# Patient Record
Sex: Male | Born: 1985 | Race: White | Hispanic: No | Marital: Single | Smoking: Current every day smoker
Health system: Southern US, Community
[De-identification: ages and names within clinical notes are randomized; demographics above are authoritative.]

## PROBLEM LIST (undated history)

## (undated) DIAGNOSIS — B20 Human immunodeficiency virus [HIV] disease: Secondary | ICD-10-CM

## (undated) DIAGNOSIS — J9819 Other pulmonary collapse: Secondary | ICD-10-CM

## (undated) HISTORY — PX: TONSILLECTOMY: SUR1361

## (undated) HISTORY — DX: Other pulmonary collapse: J98.19

## (undated) HISTORY — PX: TYMPANOSTOMY TUBE PLACEMENT: SHX32

## (undated) HISTORY — DX: Human immunodeficiency virus (HIV) disease: B20

## (undated) HISTORY — PX: CHEST TUBE INSERTION: SHX231

---

## 2016-08-02 ENCOUNTER — Ambulatory Visit (INDEPENDENT_AMBULATORY_CARE_PROVIDER_SITE_OTHER): Payer: BC Managed Care – PPO | Admitting: Allergy and Immunology

## 2016-08-02 ENCOUNTER — Encounter: Payer: Self-pay | Admitting: Allergy and Immunology

## 2016-08-02 VITALS — BP 120/80 | HR 100 | Temp 98.3°F | Resp 16 | Ht 69.45 in | Wt 188.6 lb

## 2016-08-02 DIAGNOSIS — K219 Gastro-esophageal reflux disease without esophagitis: Secondary | ICD-10-CM

## 2016-08-02 DIAGNOSIS — F5101 Primary insomnia: Secondary | ICD-10-CM

## 2016-08-02 DIAGNOSIS — L0293 Carbuncle, unspecified: Secondary | ICD-10-CM

## 2016-08-02 DIAGNOSIS — H6982 Other specified disorders of Eustachian tube, left ear: Secondary | ICD-10-CM | POA: Diagnosis not present

## 2016-08-02 DIAGNOSIS — R51 Headache: Secondary | ICD-10-CM | POA: Diagnosis not present

## 2016-08-02 DIAGNOSIS — J3089 Other allergic rhinitis: Secondary | ICD-10-CM | POA: Diagnosis not present

## 2016-08-02 DIAGNOSIS — R519 Headache, unspecified: Secondary | ICD-10-CM

## 2016-08-02 MED ORDER — MUPIROCIN 2 % EX OINT: TOPICAL_OINTMENT | CUTANEOUS | 5 refills | Status: DC

## 2016-08-02 MED ORDER — RANITIDINE HCL 300 MG PO TABS: 300.0000 mg | ORAL_TABLET | Freq: Every day | ORAL | 5 refills | Status: DC

## 2016-08-02 MED ORDER — DEXLANSOPRAZOLE 60 MG PO CPDR: DELAYED_RELEASE_CAPSULE | ORAL | 5 refills | Status: DC

## 2016-08-02 MED ORDER — CYPROHEPTADINE HCL 4 MG PO TABS: ORAL_TABLET | ORAL | 5 refills | Status: DC

## 2016-08-02 MED ORDER — MONTELUKAST SODIUM 10 MG PO TABS: 10.0000 mg | ORAL_TABLET | Freq: Every day | ORAL | 5 refills | Status: DC

## 2016-08-02 NOTE — Patient Instructions (Addendum)
  1. Allergen avoidance measures  2. Treat and prevent inflammation:   A. montelukast 10 mg tablet 1 time per day  B. OTC Nasacort/Rhinocort one spray each nostril one time per day  3. Treat and prevent LPR:   A. slowly consolidate all caffeine and chocolate use  B. Dexilant 60 mg 1 time per day in a.m.  C. ranitidine 300 mg 1 time per day in p.m.  4. Treat and prevent headache and insomnia:   A. slowly consolidate all caffeine and chocolate use  B. start Periactin 4 mg tablet - one half-one tablet at bedtime  C. attempt to avoid use of analgesics  5. Treat and prevent cutaneous infections:   A. "bleach bath" at least a few times a week  B. Bactroban applied to active skin lesion 3 times a day until resolved  C. can continue topical steroid to "old" skin lesions  6. If needed:   A. nasal saline  B. OTC antihistamine  7. Tube placement in left ear?  8. Return to clinic in 4 weeks or earlier if problem

## 2016-08-02 NOTE — Progress Notes (Signed)
NEW PATIENT NOTE  Referring Provider: Ignacia Marvel,* Primary Provider: Ignacia Marvel, DO Date of office visit: 08/02/2016    Subjective:   Chief Complaint:  Don Reilly (DOB: July 11, 1985) is a 31 y.o. male who presents to the clinic on 08/02/2016 with a chief complaint of Sinus Problem .  HPI: Don Reilly presents to this clinic in evaluation of several issues.  First, he has nasal congestion and feeling as though his airway is constricted. He has minimal amounts of sneezing and has no anosmia or ugly nasal discharge. He does use Flonase on a relatively consistent basis. There is no obvious provoking factor giving rise to this issue.  Second, he has headaches located at the top of his head that are throbbing and splitting on a daily basis but usually start at work. He believes that this is related to looking into a computer. He will get associated "static" vision with these headaches but does not get any associated vomiting or other neurological symptoms. He does find it hard to function. He takes ibuprofen on a daily basis and sometimes uses ibuprofen even before going to work. He does drink caffeine at one cup of coffee per day and 1 tea per day and eats chocolate about every other day and does not consume any alcohol.  Third, he has constant postnasal drip and throat clearing. He has a glob stuck in his throat and can't clear out his throat and has intermittent raspy voice. He does have reflux disease and regurgitates about twice a week with a burning material. As noted above he does consume caffeine on a daily basis and chocolate about every other day.  Fourth, he's been having problems with his left ear. He was evaluated at [facility] where ETD was documented without evidence of cholesteatoma. He did have placement of ear ventilation tubes as a child but there was no discussion about placing a tube in his left ear with his last ENT visit at [facility]. He does complain about  having some muffled hearing and he has difficulty placing where sounds are originating from.  Fifth, he states that he vomits when eating shrimp. He has no associated systemic or constitutional symptoms. He does not really eat shellfish in general.  Sixth, he has an issue with rashes on his skin. He develops these red raised inflamed areas that appear to leave behind a scar. He has them across his body. He's been diagnosed with atopic dermatitis and cutaneous infections and fungus among other diagnoses. He's been treated with topical steroids and what sounds like various antibiotics for MRSA. He has no other infections that are active other than the fact that he is HIV positive with undetectable RNA and CD4 above 500 while on anti-HIV therapy.  Past Medical History:  Diagnosis Date  . HIV (human immunodeficiency virus infection) (HCC)   . Lung collapse     Past Surgical History:  Procedure Laterality Date  . CHEST TUBE INSERTION     For collapsed lung  . TONSILLECTOMY    . TYMPANOSTOMY TUBE PLACEMENT      Allergies as of 08/02/2016   No Known Allergies     Medication List      APPLE CIDER VINEGAR PO Take by mouth.   CHARCOAL ACTIVATED PO Take by mouth 2 (two) times daily.   fluocinonide cream 0.05 % Commonly known as:  LIDEX   GENVOYA 150-150-200-10 MG Tabs tablet Generic drug:  elvitegravir-cobicistat-emtricitabine-tenofovir Take 1 tablet by mouth daily.  hydrOXYzine 50 MG tablet Commonly known as:  ATARAX/VISTARIL   multivitamin tablet Take 1 tablet by mouth daily.   Vitamin D3 1000 units Caps Take by mouth.       Review of systems negative except as noted in HPI / PMHx or noted below:  Review of Systems  Constitutional: Negative.   HENT: Negative.   Eyes: Negative.   Respiratory: Negative.   Cardiovascular: Negative.   Gastrointestinal: Negative.   Genitourinary: Negative.   Musculoskeletal: Negative.   Skin: Negative.   Neurological: Negative.     Endo/Heme/Allergies: Negative.   Psychiatric/Behavioral: Negative.     Family History  Problem Relation Age of Onset  . Aneurysm Father     Aortic aneurysm  . Cancer Sister   . Heart disease Maternal Grandmother   . Leukemia Paternal Grandmother   . Heart attack Paternal Grandfather     Social History   Social History  . Marital status: Unknown    Spouse name: N/A  . Number of children: N/A  . Years of education: N/A   Occupational History  . Not on file.   Social History Main Topics  . Smoking status: Current Every Day Smoker    Packs/day: 0.50    Types: Cigarettes  . Smokeless tobacco: Never Used  . Alcohol use No  . Drug use: No  . Sexual activity: Not on file   Other Topics Concern  . Not on file   Social History Narrative  . No narrative on file    Environmental and Social history  Don Reilly lives in a house with a dry environment, no animals located inside the household, carpeting in the bedroom, no plastic on the bed or pillow, actually smoking tobacco products. 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  Objective:   Vitals:   08/02/16 0952  BP: 120/80  Pulse: 100  Resp: 16  Temp: 98.3 F (36.8 C)   Height: 5' 9.45" (176.4 cm) Weight: 188 lb 9.6 oz (85.5 kg)  Physical Exam  Constitutional: He is well-developed, well-nourished, and in no distress.  HENT:  Head: Normocephalic. Head is without right periorbital erythema and without left periorbital erythema.  Right Ear: Tympanic membrane, external ear and ear canal normal.  Left Ear: External ear and ear canal normal. Tympanic membrane is scarred and retracted.  Nose: Nose normal. No mucosal edema or rhinorrhea.  Mouth/Throat: Uvula is midline, oropharynx is clear and moist and mucous membranes are normal. No oropharyngeal exudate.  Eyes: Conjunctivae and lids are normal. Pupils are equal, round, and reactive to light.  Neck: Trachea normal. No tracheal tenderness present. No  tracheal deviation present. No thyromegaly present.  Cardiovascular: Normal rate, regular rhythm, S1 normal, S2 normal and normal heart sounds.   No murmur heard. Pulmonary/Chest: Effort normal and breath sounds normal. No stridor. No tachypnea. No respiratory distress. He has no wheezes. He has no rales. He exhibits no tenderness.  Abdominal: Soft. He exhibits no distension and no mass. There is no hepatosplenomegaly. There is no tenderness. There is no rebound and no guarding.  Musculoskeletal: He exhibits no edema or tenderness.  Lymphadenopathy:       Head (right side): No tonsillar adenopathy present.       Head (left side): No tonsillar adenopathy present.    He has no cervical adenopathy.    He has no axillary adenopathy.  Neurological: He is alert. Gait normal.  Skin: Rash (multiple slightly erythematous nodules with evidence of excoriation and  unroofing across his lower extremities and trunk and left hand.) noted. He is not diaphoretic. No erythema. No pallor. Nails show no clubbing.  Psychiatric: Mood and affect normal.    Diagnostics: Allergy skin tests were performed. He demonstrated hypersensitivity against house dust mite  Assessment and Plan:    1. Other allergic rhinitis   2. ETD (Eustachian tube dysfunction), left   3. Headache disorder   4. Primary insomnia   5. LPRD (laryngopharyngeal reflux disease)   6. Carbuncle     1. Allergen avoidance measures  2. Treat and prevent inflammation:   A. montelukast 10 mg tablet 1 time per day  B. OTC Nasacort/Rhinocort one spray each nostril one time per day  3. Treat and prevent LPR:   A. slowly consolidate all caffeine and chocolate use  B. Dexilant 60 mg 1 time per day in a.m.  C. ranitidine 300 mg 1 time per day in p.m.  4. Treat and prevent headache and insomnia:   A. slowly consolidate all caffeine and chocolate use  B. start Periactin 4 mg tablet - one half-one tablet at bedtime  C. attempt to avoid use of  analgesics  5. Treat and prevent cutaneous infections:   A. "bleach bath" at least a few times a week  B. Bactroban applied to active skin lesion 3 times a day until resolved  C. can continue topical steroid to "old" skin lesions  6. If needed:   A. nasal saline  B. OTC antihistamine  7. Tube placement in left ear?  8. Return to clinic in 4 weeks or earlier if problem  Dennise has several distinct issues ongoing at this point in time that require attention. He appears to have some inflammation of his respiratory tract for which he will utilize anti-inflammatory agents and allergen avoidance measures. 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 As well he appears to have a component of LPR and ETD for which she will consolidate his caffeine consumption and utilize therapy directed against gastric acid production and possibly consider ear ventilation tube placement in his left ear. He does not do well with medical therapy over the course of the next several weeks. Finally, he has a history very consistent with recurrent cutaneous infections and I'm going to have him utilize a bleach bath on a pretty regular basis as well as using Bactroban to any new skin lesions that develop. I'll regroup with him in 4 weeks to assess his response to this plan and consider further evaluation and treatment based upon his response.  Laurette Schimke, MD Allergy / Immunology [facility]

## 2016-09-03 ENCOUNTER — Ambulatory Visit: Payer: BC Managed Care – PPO | Admitting: Allergy and Immunology

## 2016-09-05 ENCOUNTER — Ambulatory Visit (HOSPITAL_COMMUNITY)
Admission: EM | Admit: 2016-09-05 | Discharge: 2016-09-05 | Disposition: A | Discharge status: Home / Self Care | Payer: BC Managed Care – PPO | Attending: Emergency Medicine | Admitting: Emergency Medicine

## 2016-09-05 ENCOUNTER — Encounter (HOSPITAL_COMMUNITY): Payer: Self-pay | Admitting: Emergency Medicine

## 2016-09-05 DIAGNOSIS — K580 Irritable bowel syndrome with diarrhea: Secondary | ICD-10-CM | POA: Diagnosis not present

## 2016-09-05 DIAGNOSIS — L739 Follicular disorder, unspecified: Secondary | ICD-10-CM

## 2016-09-05 MED ORDER — DICYCLOMINE HCL 20 MG PO TABS: 20.0000 mg | ORAL_TABLET | Freq: Two times a day (BID) | ORAL | 0 refills | Status: AC

## 2016-09-05 MED ORDER — DOXYCYCLINE HYCLATE 100 MG PO CAPS: 100.0000 mg | ORAL_CAPSULE | Freq: Two times a day (BID) | ORAL | 0 refills | Status: DC

## 2016-09-05 NOTE — ED Triage Notes (Signed)
The patient presented to the [facility] with a complaint of abdominal bloating and pressure. The patient also reported some "issues" with his skin. The patient related his symptoms to contaminated water.

## 2016-09-05 NOTE — Discharge Instructions (Signed)
Your Abdominal symptoms are consistent with IBS, I have started you on Bentyl, take 1 tablet twice a day. Increase dietary fiber, decrease greasy foods, follow up with your primary care provider.  For your folliculitis, I am switching your antibiotic to Doxycycline. Take 1 tablet twice a day for 10 days, follow up with your primary care provider or dermatology.

## 2016-09-05 NOTE — ED Provider Notes (Signed)
CSN: [id number]     Arrival date & time 09/05/16  1123 History   First MD Initiated Contact with Patient 09/05/16 1253     Chief Complaint  Patient presents with  . Bloated   (Consider location/radiation/quality/duration/timing/severity/associated sxs/prior Treatment) 31 year old male with a prior history of HIV presents to clinic for a 6 month history of abdominal pain, nearly daily, with frequent loose stools, 3-4 times a day, and a sensation of "bloating" with defecation. Has no fever, no change in diet, no nausea, vomiting, change in appetite, weakness, or other symptoms.  Also complaining of "folliculitis" has taken Bactrim prescribed by his PCP, without relief. Has multiple, small, erythemic, pustules located on his legs, and abdomen, states it is worse when he shaves, and he has shaved these areas recently.   The history is provided by the patient.    Past Medical History:  Diagnosis Date  . HIV (human immunodeficiency virus infection) (HCC)   . Lung collapse    Past Surgical History:  Procedure Laterality Date  . CHEST TUBE INSERTION     For collapsed lung  . TONSILLECTOMY    . TYMPANOSTOMY TUBE PLACEMENT     Family History  Problem Relation Age of Onset  . Aneurysm Father     Aortic aneurysm  . Cancer Sister   . Heart disease Maternal Grandmother   . Leukemia Paternal Grandmother   . Heart attack Paternal Grandfather    Social History  Substance Use Topics  . Smoking status: Current Every Day Smoker    Packs/day: 0.50    Types: Cigarettes  . Smokeless tobacco: Never Used  . Alcohol use No    Review of Systems  Constitutional: Negative.   HENT: Negative.   Respiratory: Negative.   Cardiovascular: Negative.   Gastrointestinal: Positive for abdominal pain. Negative for diarrhea, nausea and vomiting.  Genitourinary: Negative.   Musculoskeletal: Negative.   Skin: Positive for color change and rash.  Neurological: Negative.     Allergies  Patient has no  known allergies.  Home Medications   Prior to Admission medications   Medication Sig Start Date End Date Taking? Authorizing Provider  GENVOYA 150-150-200-10 MG TABS tablet Take 1 tablet by mouth daily.  05/30/16  Yes Historical Provider, MD  dicyclomine (BENTYL) 20 MG tablet Take 1 tablet (20 mg total) by mouth 2 (two) times daily. 09/05/16   Dorena Bodo, NP  doxycycline (VIBRAMYCIN) 100 MG capsule Take 1 capsule (100 mg total) by mouth 2 (two) times daily. 09/05/16   Dorena Bodo, NP   Meds Ordered and Administered this Visit  Medications - No data to display  BP 131/83 (BP Location: Right Arm)   Pulse 94   Temp 97.6 F (36.4 C) (Oral)   Resp 16   SpO2 100%  No data found.   Physical Exam  Constitutional: He is oriented to person, place, and time. He appears well-developed and well-nourished. No distress.  HENT:  Head: Normocephalic and atraumatic.  Right Ear: External ear normal.  Left Ear: External ear normal.  Eyes: Conjunctivae are normal. Right eye exhibits no discharge. Left eye exhibits no discharge.  Neck: Normal range of motion.  Cardiovascular: Normal rate and regular rhythm.   Pulmonary/Chest: Effort normal and breath sounds normal.  Abdominal: Soft. Bowel sounds are normal. He exhibits no distension. There is no tenderness.  Neurological: He is alert and oriented to person, place, and time.  Skin: Skin is warm and dry. Rash noted. He is not diaphoretic. There is erythema.  There is pallor.  Multiple, red, erythematous, pustules, abdomen, chest, legs.  Nursing note and vitals reviewed.   Urgent Care Course     Procedures (including critical care time)  Labs Review Labs Reviewed - No data to display  Imaging Review No results found.      MDM   1. Irritable bowel syndrome with diarrhea   2. Folliculitis    Symptoms consistent with rum criteria for IBS, provided diet counseling, started on Bentyl, recommend following up with primary care for  further evaluation and management of this condition.  Pustular lesions consistent with folliculitis, recommend doxycycline twice daily. Follow-up with primary care, or dermatologist.     Dorena Bodo, NP 09/05/16 2208

## 2016-10-15 ENCOUNTER — Encounter (HOSPITAL_COMMUNITY): Payer: Self-pay | Admitting: Emergency Medicine

## 2016-10-15 ENCOUNTER — Emergency Department (HOSPITAL_COMMUNITY)
Admission: EM | Admit: 2016-10-15 | Discharge: 2016-10-15 | Disposition: A | Discharge status: Home / Self Care | Payer: BC Managed Care – PPO | Attending: Physician Assistant | Admitting: Physician Assistant

## 2016-10-15 ENCOUNTER — Emergency Department (HOSPITAL_COMMUNITY): Payer: BC Managed Care – PPO

## 2016-10-15 DIAGNOSIS — H53149 Visual discomfort, unspecified: Secondary | ICD-10-CM | POA: Diagnosis present

## 2016-10-15 DIAGNOSIS — Z79899 Other long term (current) drug therapy: Secondary | ICD-10-CM | POA: Insufficient documentation

## 2016-10-15 DIAGNOSIS — F17210 Nicotine dependence, cigarettes, uncomplicated: Secondary | ICD-10-CM | POA: Diagnosis not present

## 2016-10-15 DIAGNOSIS — B20 Human immunodeficiency virus [HIV] disease: Secondary | ICD-10-CM | POA: Insufficient documentation

## 2016-10-15 DIAGNOSIS — F69 Unspecified disorder of adult personality and behavior: Secondary | ICD-10-CM | POA: Insufficient documentation

## 2016-10-15 DIAGNOSIS — F15959 Other stimulant use, unspecified with stimulant-induced psychotic disorder, unspecified: Secondary | ICD-10-CM | POA: Insufficient documentation

## 2016-10-15 DIAGNOSIS — F99 Mental disorder, not otherwise specified: Secondary | ICD-10-CM

## 2016-10-15 LAB — CBC WITH DIFFERENTIAL/PLATELET
BASOS ABS: 0 10*3/uL (ref 0.0–0.1)
Basophils Relative: 1 %
Eosinophils Absolute: 0.1 10*3/uL (ref 0.0–0.7)
Eosinophils Relative: 2 %
HEMATOCRIT: 40.4 % (ref 39.0–52.0)
Hemoglobin: 13.2 g/dL (ref 13.0–17.0)
LYMPHS ABS: 1.2 10*3/uL (ref 0.7–4.0)
LYMPHS PCT: 24 %
MCH: 30.9 pg (ref 26.0–34.0)
MCHC: 32.7 g/dL (ref 30.0–36.0)
MCV: 94.6 fL (ref 78.0–100.0)
Monocytes Absolute: 0.7 10*3/uL (ref 0.1–1.0)
Monocytes Relative: 14 %
NEUTROS ABS: 2.9 10*3/uL (ref 1.7–7.7)
Neutrophils Relative %: 59 %
Platelets: 156 10*3/uL (ref 150–400)
RBC: 4.27 MIL/uL (ref 4.22–5.81)
RDW: 13.1 % (ref 11.5–15.5)
WBC: 5 10*3/uL (ref 4.0–10.5)

## 2016-10-15 LAB — COMPREHENSIVE METABOLIC PANEL
ALBUMIN: 3.8 g/dL (ref 3.5–5.0)
ALT: 16 U/L — ABNORMAL LOW (ref 17–63)
ANION GAP: 5 (ref 5–15)
AST: 18 U/L (ref 15–41)
Alkaline Phosphatase: 47 U/L (ref 38–126)
BILIRUBIN TOTAL: 0.7 mg/dL (ref 0.3–1.2)
BUN: 11 mg/dL (ref 6–20)
CHLORIDE: 104 mmol/L (ref 101–111)
CO2: 28 mmol/L (ref 22–32)
Calcium: 8.9 mg/dL (ref 8.9–10.3)
Creatinine, Ser: 0.93 mg/dL (ref 0.61–1.24)
GFR calc Af Amer: >60 mL/min (ref >60)
GFR calc non Af Amer: >60 mL/min (ref >60)
GLUCOSE: 100 mg/dL — AB (ref 65–99)
POTASSIUM: 3.4 mmol/L — AB (ref 3.5–5.1)
Sodium: 137 mmol/L (ref 135–145)
TOTAL PROTEIN: 6.2 g/dL — AB (ref 6.5–8.1)

## 2016-10-15 LAB — RAPID URINE DRUG SCREEN, HOSP PERFORMED
AMPHETAMINES: POSITIVE — AB
BARBITURATES: NOT DETECTED
BENZODIAZEPINES: NOT DETECTED
Cocaine: NOT DETECTED
Opiates: NOT DETECTED
TETRAHYDROCANNABINOL: NOT DETECTED

## 2016-10-15 LAB — ACETAMINOPHEN LEVEL: Acetaminophen (Tylenol), Serum: <10 ug/mL — ABNORMAL LOW (ref 10–30)

## 2016-10-15 LAB — ETHANOL: Alcohol, Ethyl (B): <5 mg/dL (ref <5)

## 2016-10-15 LAB — SALICYLATE LEVEL: Salicylate Lvl: <7 mg/dL (ref 2.8–30.0)

## 2016-10-15 NOTE — ED Triage Notes (Addendum)
Pt c/o "light sensitivity" and floaters with muscle weakness x 6 months.  Pt also c/o increase in fungus growth on nails after finding mold spores in crawl space.  Pt c/o reports feeling the is having difficulty taking deep breath.  +HA, +nausea. +lightheadedness x 2 months, abd distention x 1 month intermittently. Pt would like discoloration to R heel assessed.  Pt has wound in various stages visible on arms, abd, legs and face.  Pt appears anxious in triage.

## 2016-10-15 NOTE — ED Notes (Addendum)
Pt reports light sensitivity for the past 6 months. Pt reports he pulled something blue out of his body. Pt reports that he feels as though something is inside his body. Pt reports achy bones and muscles. Pt also reports mold exposure.

## 2016-10-15 NOTE — Discharge Instructions (Signed)
Your labs were reassuring. We thought you had mildly psychotic features today. We have given you resources to help as an outpatient. He also had positive methamphetamines. Please consider stopping taking these.  Please follow-up with your primary care about other concerns.  Behavioral Health Resources in the [facility]  Intensive Outpatient Programs: [facility]      [address] [place], [place] [phone] Both a day and evening program       [facility]     [address]        [place], [place] [address] [phone]         [facility] [address] [place] [phone]  [facility] ACCESS LINE: [phone] or [phone] [address] [place], [place] [address] [email or web address]  Mobile Crisis Teams:                                        [facility]         Mobile Crisis Care Unit [phone]             [facility] [address] [place] [phone]                                         [facility] [address] [place] [phone]  Self-Help/Support Groups: [facility] Health [facility]oc[facility] of [facility] [place] Variety of support groups [phone] (call for more info)  Narcotics Anonymous (NA) [facility]aring [facility] [address] [place] - 2 meetings at this location  Residential Treatment Programs:  [facility]      [address]        [place]       [phone]         [facility] [address], [place] [address] [place], [place]  [id number] [phone]  [facility]  [address] [place], [place] [address] [phone] Admissions: 8am-3pm M-F  [facility]     [address]        [place], [place] [address]       [phone]         [facility] [address] [place], [place] [phone]  [facility] [address] [place], [place] [phone]  [facility] - Intensive Outpatient      [address]     [place], [place]       [phone]         [facility]     [address] [place], [place] [phone] or [phone]  [facility] ([facility])  [address] [place], [place] [phone]  [facility] [address]                                               [address] [place] [phone]  [facility] Resources: [phone]- [phone]               General Therapy  Domenic Schwab, PhD        [place], [place] [address]         [facility]   [address] [place], [place] [address] [phone]  [facility] [address] [place], [place] [address] [phone] Insurance/Medicaid/sponsorship through [facility]                                              [address] [place]                                        [place], [place] [address]    Therapy/tele-psych/case         [place] [address], [place]  [address]  Adolescent/group home/case management [phone]                                           Rosette Reveal PhD       General therapy       Insurance   [phone]         Dr. Adele Schilder Insurance [phone] M-F  [facility] Medicaid, sponsorship [phone]

## 2016-10-15 NOTE — ED Provider Notes (Signed)
[facility]-EMERGENCY DEPT Provider Note   CSN: [id number] Arrival date & time: 10/15/16  0117  By signing my name below, I, Diona Browner, attest that this documentation has been prepared under the direction and in the presence of Kyden Potash, Cindee Salt, MD. Electronically Signed: Diona Browner, ED Scribe. 10/15/16. 2:25 AM.  History   Chief Complaint Chief Complaint  Patient presents with  . Light sensitivity    HPI Don Reilly is a 31 y.o. male with a PMHx of HIV who presents to the Emergency Department complaining of light sensitivity for the last 6 months. Pt reports feeling drained after being under bright lights and long exposures to cell phone screens. He also notes metal burns him to the touch.   Additionally, pt c/o of something in his stomach, possibly "blue stringy worms", which he has pulled out from multiple lesions on his abdomen. He notes lesions haven't improved and blisters on his hands for the last 4 months.   Finally, he c/o "exposure to gross amounts of fungus" that was found in his house.  Associated sx include diaphoresis, chills, body aches, and unexplained weight change without changing his diet.  Pt is on genvoya for HIV, which is monitored by his Dr. at [facility]. 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 He has not tried anything to alleviate his sx. Pt denies SI and HI.  The history is provided by the patient. No language interpreter was used.    Past Medical History:  Diagnosis Date  . HIV (human immunodeficiency virus infection) (HCC)   . Lung collapse     There are no active problems to display for this patient.   Past Surgical History:  Procedure Laterality Date  . CHEST TUBE INSERTION     For collapsed lung  . TONSILLECTOMY    . TYMPANOSTOMY TUBE PLACEMENT         Home Medications    Prior to Admission medications   Medication Sig Start Date End Date Taking? Authorizing Provider  dicyclomine (BENTYL) 20 MG tablet Take 1  tablet (20 mg total) by mouth 2 (two) times daily. 09/05/16   Dorena Bodo, NP  doxycycline (VIBRAMYCIN) 100 MG capsule Take 1 capsule (100 mg total) by mouth 2 (two) times daily. 09/05/16   Dorena Bodo, NP  GENVOYA 150-150-200-10 MG TABS tablet Take 1 tablet by mouth daily.  05/30/16   [provider]    Family History Family History  Problem Relation Age of Onset  . Aneurysm Father        Aortic aneurysm  . Cancer Sister   . Heart disease Maternal Grandmother   . Leukemia Paternal Grandmother   . Heart attack Paternal Grandfather     Social History Social History  Substance Use Topics  . Smoking status: Current Every Day Smoker    Packs/day: 0.50    Types: Cigarettes  . Smokeless tobacco: Never Used  . Alcohol use No     Allergies   Patient has no known allergies.   Review of Systems Review of Systems  Constitutional: Positive for chills, diaphoresis and unexpected weight change.  Eyes: Positive for photophobia.  Musculoskeletal: Positive for arthralgias and myalgias.  Skin: Positive for wound.  Psychiatric/Behavioral: Negative for suicidal ideas.  All other systems reviewed and are negative.    Physical Exam Updated Vital Signs BP (!) 141/85 (BP Location: Left Arm)   Pulse 100   Temp 98.4 F (36.9 C) (Oral)   Resp 20   Ht  5\' 11"  (1.803 m)   Wt 193 lb (87.5 kg)   SpO2 100%   BMI 26.92 kg/m   Physical Exam  Constitutional: He is oriented to person, place, and time. He appears well-developed and well-nourished.  HENT:  Head: Normocephalic.  Eyes: EOM are normal.  Neck: Normal range of motion.  Pulmonary/Chest: Effort normal and breath sounds normal.  Abdominal: Soft. Bowel sounds are normal. He exhibits no distension. There is no tenderness.  Musculoskeletal: Normal range of motion.  Neurological: He is alert and oriented to person, place, and time.  Skin:  Scattered lesions.  No active infection.  Psychiatric: He has a normal mood  and affect.  Pt persceverating on odd sx. Affect odd.   Nursing note and vitals reviewed.    ED Treatments / Results  DIAGNOSTIC STUDIES: Oxygen Saturation is 100% on RA, normal by my interpretation.   COORDINATION OF CARE: 2:25 AM-Discussed next steps with pt. Pt verbalized understanding and is agreeable with the plan.   Labs (all labs ordered are listed, but only abnormal results are displayed) Labs Reviewed - No data to display  EKG  EKG Interpretation None       Radiology No results found.  Procedures Procedures (including critical care time)  Medications Ordered in ED Medications - No data to display   Initial Impression / Assessment and Plan / ED Course  I have reviewed the triage vital signs and the nursing notes.  Pertinent labs & imaging results that were available during my care of the patient were reviewed by me and considered in my medical decision making (see chart for details).     I personally performed the services described in this documentation, which was scribed in my presence. The recorded information has been reviewed and is accurate.   Patient is a 31 year old male with multiple inpatient psychiatric hospitalizations for psychotic symptoms. Patient here today with multiple symptoms that appear psychotic in nature. He is talking about blue worms that could be in his abdomen. When he showed me a picture it was lint. Perseverating on multiple "symptoms" he has.   I think he will require TTS clearance for me to feel safe discharging him. He has no active HI or SI. But he does feel like something is inside his body.   6:11 AM Discussed with TTS. They do not feel patient needs inpatient requirement. 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 He's been seen for this in the past. We will offer outpatient resources.   Final Clinical Impressions(s) / ED Diagnoses   Final diagnoses:  None    New  Prescriptions New Prescriptions   No medications on file     Abelino DerrickMackuen, Lamon Rotundo Lyn, MD 10/15/16 [phone]

## 2016-10-15 NOTE — BH Assessment (Addendum)
Tele Assessment Note   Don Reilly is an 31 y.o. single male who [facility] reporting light sensitivity, that he feels drained after being under bright lights and that metal burns to the touch. He also reports multiple lesions on his abdomen. Dr. Corlis LeakMackuen documented "pt c/o of something in his stomach, possibly "blue stringy worms", which he has pulled out from multiple lesions on his abdomen." He reports "exposure to gross amounts of fungus" that was found in his housePt says that the worms comment was a misunderstanding. Pt denies depressive symptoms and describes his mood as "even." He denies problems with anxiety. He denies current suicidal ideation or history of suicide attempts. Pt denies current homicidal ideation or history of violence. Pt denies auditory or visual hallucinations. Pt denies any history of alcohol or substance use. When asked why his UDS is positive for amphetamines Pt cannot provide an explanation.  Pt denies any stressors other than presenting medical concerns. 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 [facility] with similar presentation and was discharged.  Pt is dressed in hospital gown, oriented x4 with normal speech and normal motor behavior. Pt initially very drowsy then more alert. Eye contact is fair. Pt's mood is euthymic and affect is congruent with mood. Thought process is coherent and relevant. There is no indication Pt is currently responding to internal stimuli. Pt is focused on somatic symptoms. Pt says he does not have mental health or substance abuse problems.   Diagnosis: Amphetamine-induced Psychotic Disorder  Past Medical  History:  Past Medical History:  Diagnosis Date  . HIV (human immunodeficiency virus infection) (HCC)   . Lung collapse     Past Surgical History:  Procedure Laterality Date  . CHEST TUBE INSERTION     For collapsed lung  . TONSILLECTOMY    . TYMPANOSTOMY TUBE PLACEMENT      Family History:  Family History  Problem Relation Age of Onset  . Aneurysm Father        Aortic aneurysm  . Cancer Sister   . Heart disease Maternal Grandmother   . Leukemia Paternal Grandmother   . Heart attack Paternal Grandfather     Social History:  reports that he has been smoking Cigarettes.  He has been smoking about 0.50 packs per day. He has never used smokeless tobacco. He reports that he does not drink alcohol or use drugs.  Additional Social History:  Alcohol / Drug Use Pain Medications: See MAR Prescriptions: See MAR Over the Counter: See MAR History of alcohol / drug use?: No history of alcohol / drug abuse (Pt has no explanation why UDS is positive for amphetamines) Longest period of sobriety (when/how long): NA  CIWA: CIWA-Ar BP: 123/81 Pulse Rate: 94 COWS:    PATIENT STRENGTHS: (choose at least two) Ability for insight Average or above average intelligence Capable of independent living MetallurgistCommunication skills Financial means General fund of knowledge Physical Health Supportive family/friends Work skills  Allergies: No Known Allergies  Home Medications:  (Not in a hospital admission)  OB/GYN Status:  No LMP for male patient.  General Assessment Data [facility] Assessment: In system Is this a Tele or Face-to-Face Assessment?: Tele Assessment Is  this an Initial Assessment or a Re-assessment for this encounter?: Initial Assessment Marital status: Single Maiden name: NA Is patient pregnant?: No Pregnancy Status: No Living Arrangements: Alone Can pt return to current living arrangement?: Yes Admission Status: Voluntary Is patient capable of  signing voluntary admission?: Yes Referral Source: Self/Family/Friend Insurance type: BCBS     Crisis Care Plan Living Arrangements: Alone Legal Guardian: Other: (Self) Name of Psychiatrist: None Name of Therapist: None  Education Status Is patient currently in school?: No Current Grade: NA Highest grade of school patient has completed: 29 Name of school: NA Contact person: NA  Risk to self with the past 6 months Suicidal Ideation: No Has patient been a risk to self within the past 6 months prior to admission? : No Suicidal Intent: No Has patient had any suicidal intent within the past 6 months prior to admission? : No Is patient at risk for suicide?: No Suicidal Plan?: No Has patient had any suicidal plan within the past 6 months prior to admission? : No Access to Means: No What has been your use of drugs/alcohol within the last 12 months?: Pt denies Previous Attempts/Gestures: No How many times?: 0 Other Self Harm Risks: None Triggers for Past Attempts: None known Intentional Self Injurious Behavior: None Family Suicide History: No Recent stressful life event(s): Other (Comment) (Physical symptoms) Persecutory voices/beliefs?: No Depression: No Depression Symptoms:  (Pt denies depressive symptoms) Substance abuse history and/or treatment for substance abuse?: No Suicide prevention information given to non-admitted patients: Not applicable  Risk to Others within the past 6 months Homicidal Ideation: No Does patient have any lifetime risk of violence toward others beyond the six months prior to admission? : No Thoughts of Harm to Others: No Current Homicidal Intent: No Current Homicidal Plan: No Access to Homicidal Means: No Identified Victim: None History of harm to others?: No Assessment of Violence: None Noted Violent Behavior Description: Pt denies history of violence Does patient have access to weapons?: No Criminal Charges Pending?: No Does patient have a  court date: No Is patient on probation?: No  Psychosis Hallucinations: None noted Delusions: None noted  Mental Status Report Appearance/Hygiene: In hospital gown Eye Contact: Fair Motor Activity: Unremarkable Speech: Logical/coherent Level of Consciousness: Alert Mood: Euthymic Affect: Appropriate to circumstance Anxiety Level: None Thought Processes: Coherent, Relevant Judgement: Unimpaired Orientation: Person, Place, Time, Situation, Appropriate for developmental age Obsessive Compulsive Thoughts/Behaviors: None  Cognitive Functioning Concentration: Fair Memory: Recent Intact, Remote Intact IQ: Average Insight: Fair Impulse Control: Good Appetite: Good Weight Loss: 0 Weight Gain: 0 Sleep: No Change Total Hours of Sleep: 8 Vegetative Symptoms: None  ADLScreening [facility] Assessment Services) Patient's cognitive ability adequate to safely complete daily activities?: Yes Patient able to express need for assistance with ADLs?: Yes Independently performs ADLs?: Yes (appropriate for developmental age)  Prior Inpatient Therapy Prior Inpatient Therapy: No Prior Therapy Dates: NA Prior Therapy Facilty/Provider(s): NA Reason for Treatment: NA  Prior Outpatient Therapy Prior Outpatient Therapy: No Prior Therapy Dates: NA Prior Therapy Facilty/Provider(s): NA Reason for Treatment: NA Does patient have an ACCT team?: No Does patient have Intensive In-House Services?  : No Does patient have [facility] services? : No Does patient have P4CC services?: No  ADL Screening (condition at time of admission) Patient's cognitive ability adequate to safely complete daily activities?: Yes Is the patient deaf or have difficulty hearing?: No Does the patient have difficulty seeing, even when wearing glasses/contacts?: No Does the patient have difficulty concentrating, remembering, or making decisions?: No Patient  able to express need for assistance with ADLs?: Yes Does the patient have  difficulty dressing or bathing?: No Independently performs ADLs?: Yes (appropriate for developmental age) Does the patient have difficulty walking or climbing stairs?: No Weakness of Legs: None Weakness of Arms/Hands: None  Home Assistive Devices/Equipment Home Assistive Devices/Equipment: None    Abuse/Neglect Assessment (Assessment to be complete while patient is alone) Physical Abuse: Denies Verbal Abuse: Denies Sexual Abuse: Denies Exploitation of patient/patient's resources: Denies Self-Neglect: Denies     Merchant navy officer (For Healthcare) Does Patient Have a Medical Advance Directive?: No Would patient like information on creating a medical advance directive?: No - Patient declined    Additional Information 1:1 In Past 12 Months?: No CIRT Risk: No Elopement Risk: No Does patient have medical clearance?: Yes     Disposition: Gave clinical report to Dr. Ardeth Sportsman who said Pt does not meet criteria for inpatient dual-diagnosis treatment and recommends Pt be referred to Chemical Dependency IOP. Discussed recommendation with Dr. Kandis Mannan and Loistine Chance, RN. Faxed information for CD IOP to [facility].   Disposition Initial Assessment Completed for this Encounter: Yes Disposition of Patient: Other dispositions Other disposition(s): Other (Comment)   Pamalee Leyden, [facility], [facility], [facility] Triage Specialist [phone]   Patsy Baltimore, Harlin Rain 10/15/2016 5:43 AM

## 2018-10-11 IMAGING — DX DG CHEST 2V
2 series · 2 of 2 positions shown · non-contrast
Comparison: None.

CLINICAL DATA: Chronic photosensitivity, headache, nausea and
lightheadedness. Initial encounter.

EXAM:
CHEST  2 VIEW

[chest pa]
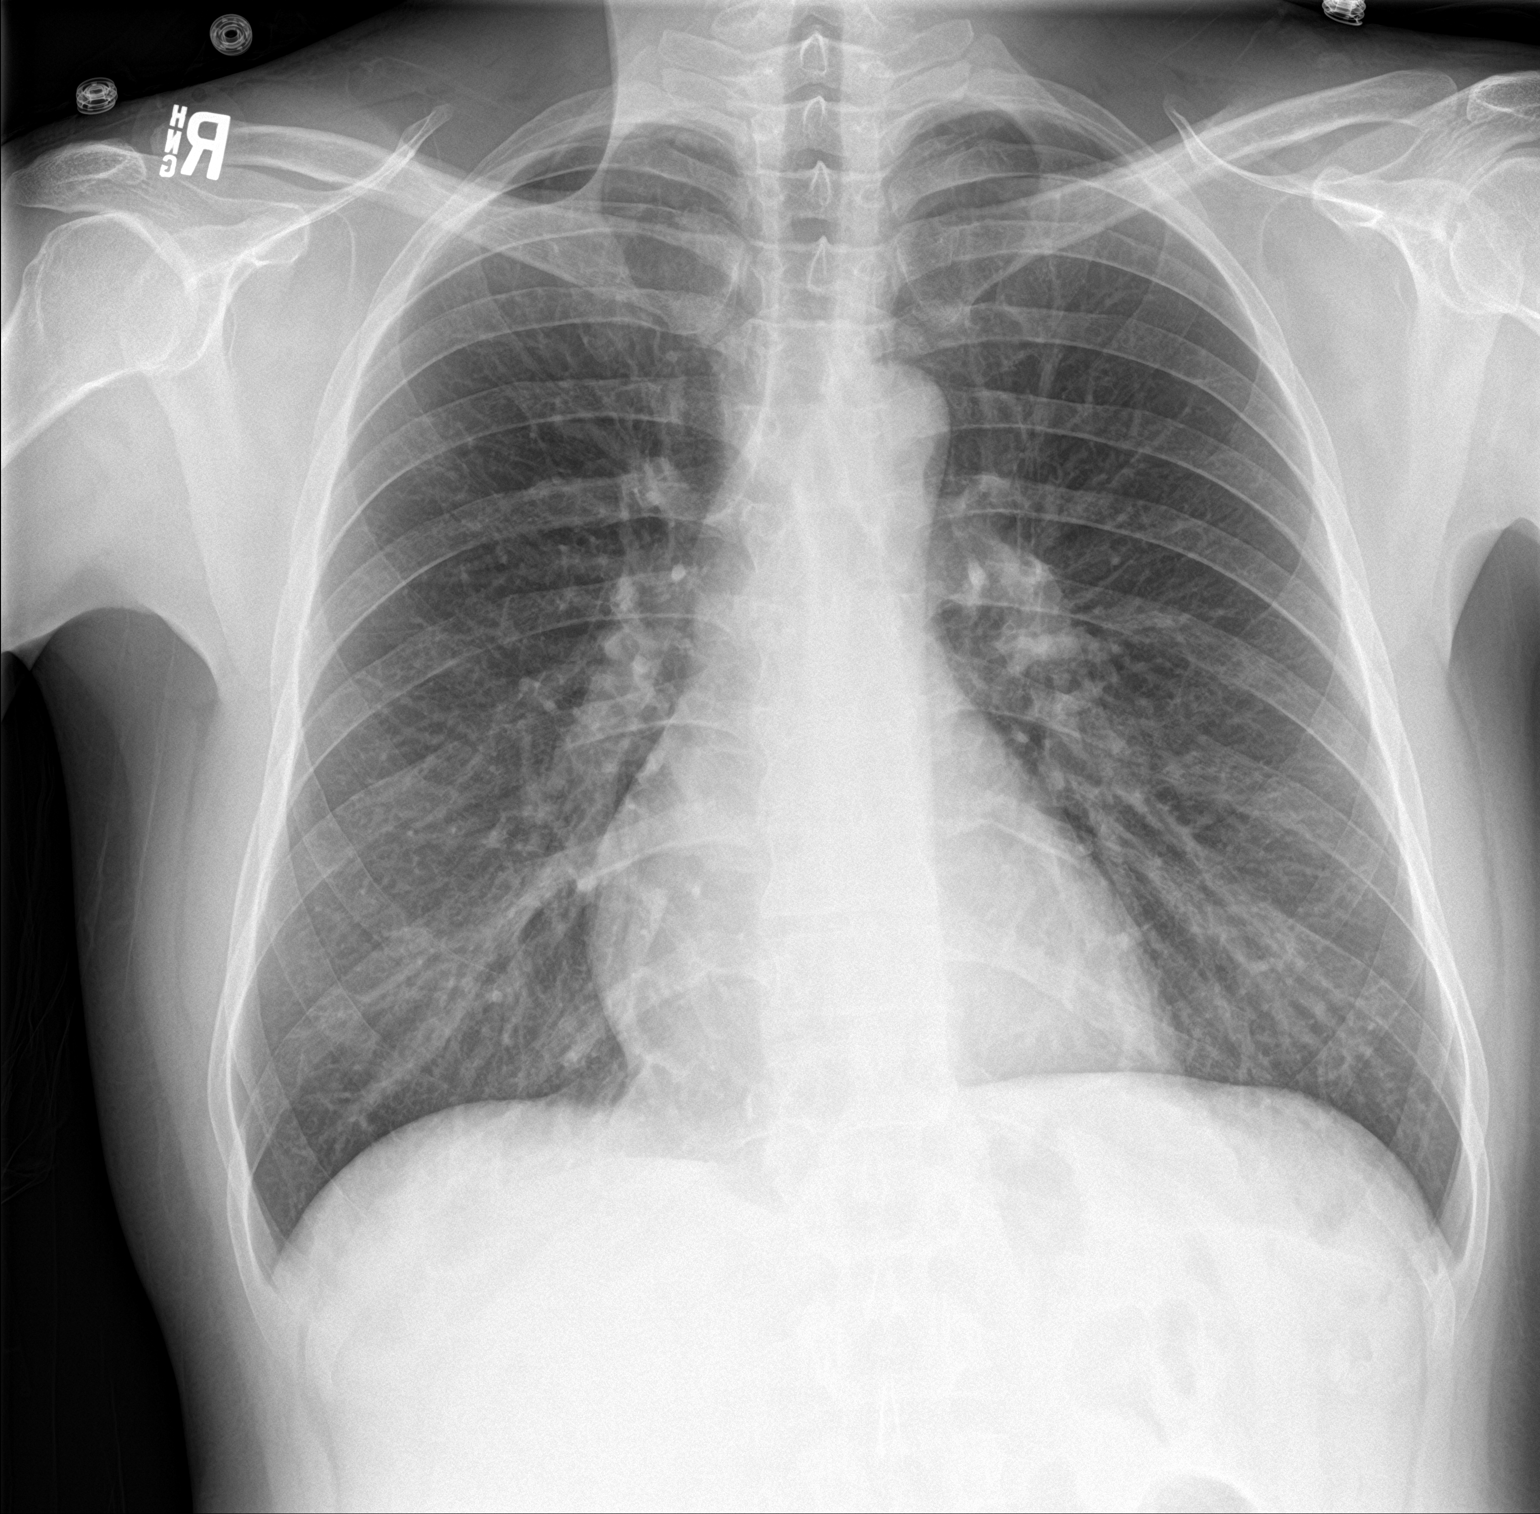

[chest lat]
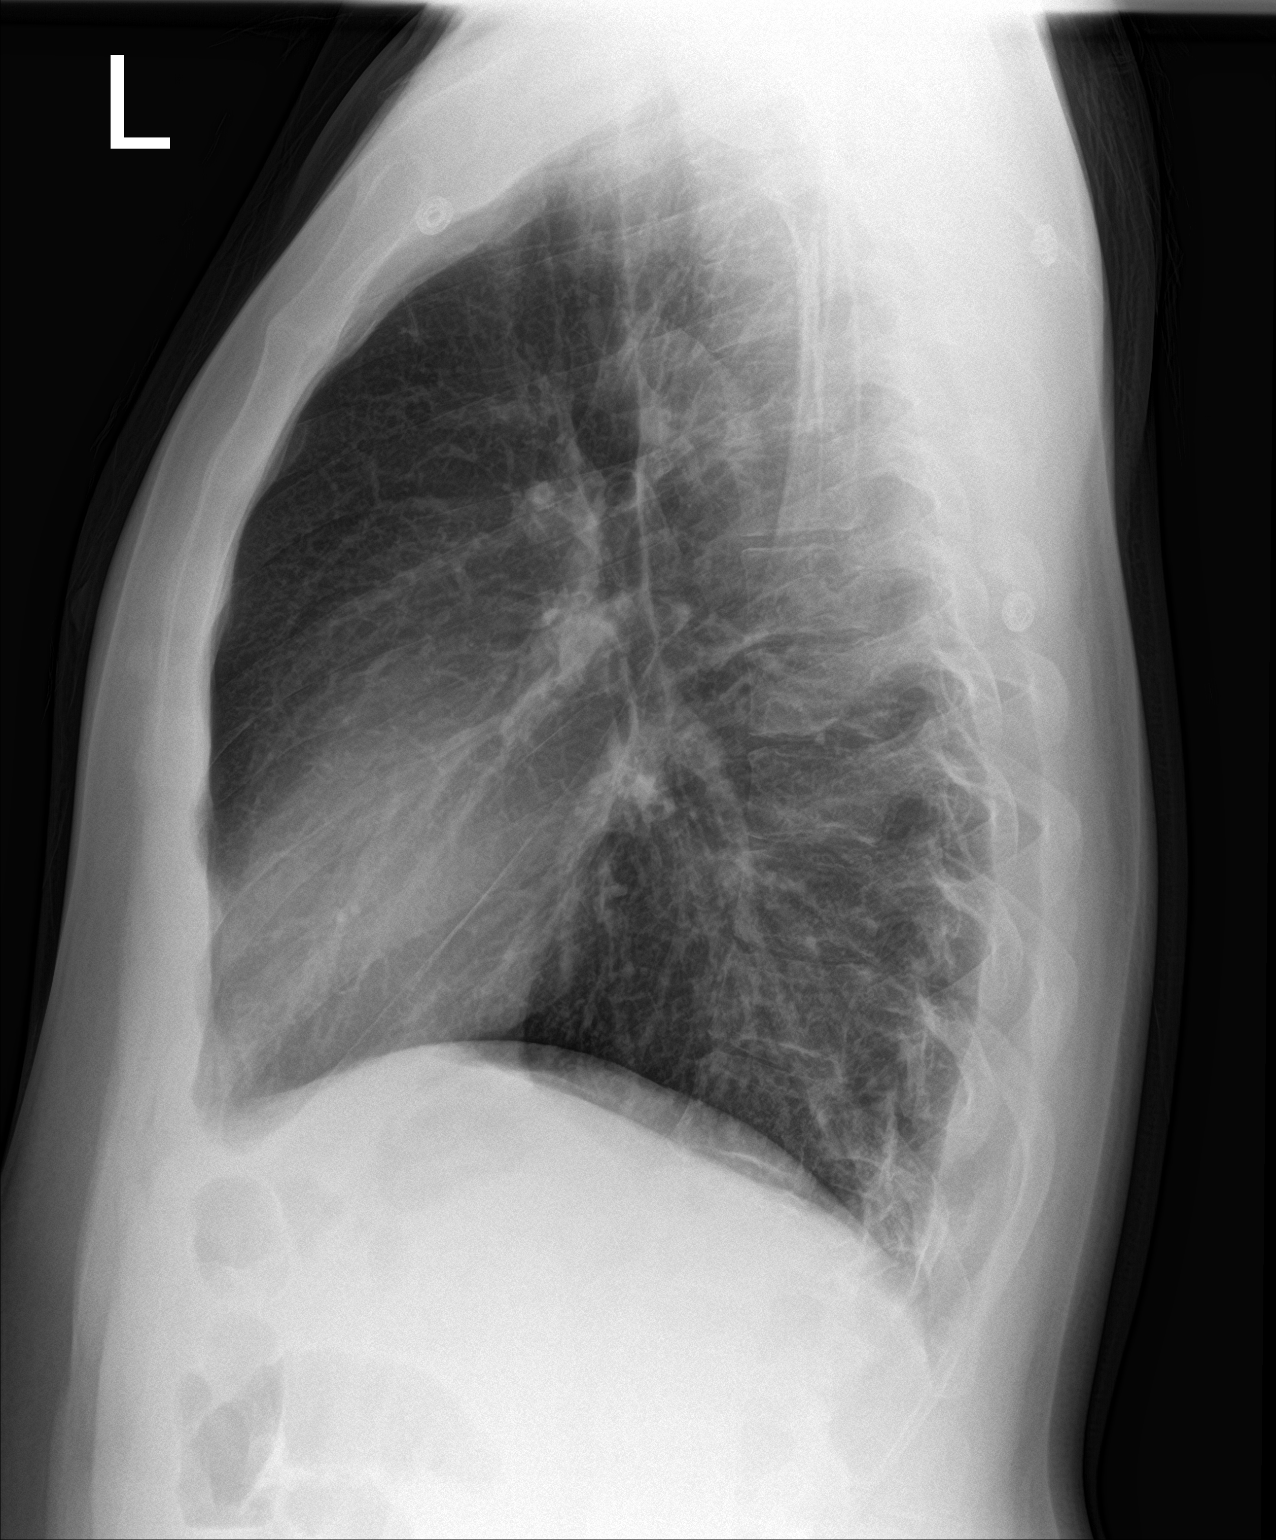

[2 of 2 positions shown; findings below may reference images not displayed]

FINDINGS: The lungs are well-aerated. Mild peribronchial thickening is noted.
There is no evidence of focal opacification, pleural effusion or
pneumothorax.

The heart is normal in size; the mediastinal contour is within
normal limits. No acute osseous abnormalities are seen.
IMPRESSION: Mild peribronchial thickening noted.  Lungs otherwise clear.
